# Patient Record
Sex: Male | Born: 1960 | Race: White | Hispanic: No | Marital: Married | Smoking: Never smoker
Health system: Southern US, Community
[De-identification: ages and names within clinical notes are randomized; demographics above are authoritative.]

## PROBLEM LIST (undated history)

## (undated) DIAGNOSIS — I10 Essential (primary) hypertension: Secondary | ICD-10-CM

## (undated) DIAGNOSIS — E079 Disorder of thyroid, unspecified: Secondary | ICD-10-CM

## (undated) DIAGNOSIS — E785 Hyperlipidemia, unspecified: Secondary | ICD-10-CM

---

## 2005-08-29 ENCOUNTER — Emergency Department: Payer: Self-pay | Admitting: Unknown Physician Specialty

## 2013-09-07 ENCOUNTER — Encounter: Payer: Self-pay | Admitting: Emergency Medicine

## 2013-09-07 ENCOUNTER — Emergency Department (INDEPENDENT_AMBULATORY_CARE_PROVIDER_SITE_OTHER): Payer: BC Managed Care – PPO

## 2013-09-07 ENCOUNTER — Emergency Department
Admission: EM | Admit: 2013-09-07 | Discharge: 2013-09-07 | Disposition: A | Discharge status: Home Health | Payer: BC Managed Care – PPO | Source: Home / Self Care | Attending: Emergency Medicine | Admitting: Emergency Medicine

## 2013-09-07 DIAGNOSIS — K352 Acute appendicitis with generalized peritonitis, without abscess: Secondary | ICD-10-CM

## 2013-09-07 DIAGNOSIS — K37 Unspecified appendicitis: Secondary | ICD-10-CM

## 2013-09-07 HISTORY — DX: Essential (primary) hypertension: I10

## 2013-09-07 HISTORY — DX: Hyperlipidemia, unspecified: E78.5

## 2013-09-07 HISTORY — DX: Disorder of thyroid, unspecified: E07.9

## 2013-09-07 LAB — POCT CBC W AUTO DIFF (K'VILLE URGENT CARE)

## 2013-09-07 LAB — POCT URINALYSIS DIPSTICK
Glucose, UA: NEGATIVE
Leukocytes, UA: NEGATIVE
Nitrite, UA: NEGATIVE
Spec Grav, UA: 1.025 (ref 1.005–1.03)
Urobilinogen, UA: 1 (ref 0–1)
pH, UA: 6.5 (ref 5–8)

## 2013-09-07 MED ORDER — IOHEXOL 300 MG/ML  SOLN
100.0000 mL | Freq: Once | INTRAMUSCULAR | Status: AC | PRN
  Administered 2013-09-07: 100 mL via INTRAVENOUS

## 2013-09-07 NOTE — ED Notes (Signed)
Reports onset of abdominal pain last night that was so severe he almost went to ER; some nausea but no vomiting or diarrhea. Pain now localized in right middle quadrant and is mild to moderate.

## 2013-09-07 NOTE — ED Provider Notes (Signed)
CSN: [id number]     Arrival date & time 09/07/13  1444 History   First MD Initiated Contact with Patient 09/07/13 1453     Chief Complaint  Patient presents with  . Abdominal Pain    Patient is a 53 y.o. male presenting with abdominal pain. The history is provided by the patient and the spouse.  Abdominal Pain This is a new problem. Episode onset: Last night at 6:30 PM. The problem occurs constantly. The problem has been gradually worsening. Associated symptoms include abdominal pain. Pertinent negatives include no chest pain, no headaches and no shortness of breath. Associated symptoms comments: Nausea. The symptoms are aggravated by walking. Nothing relieves the symptoms. Treatments tried: Omeprazole and 1 tramadol. The treatment provided no relief.   Here with wife. The abdominal pain is periumbilical but over the past few hours the pain has become crampy and sharp towards the right mid abdomen and right lower quadrant. Has nausea but no vomiting or diarrhea.  Had low-volume formed BM this morning, without blood or mucus. Pain intensity 5/10. Has been as high as 8/10. No history of prior abdominal surgery. No urinary symptoms. No chest pain or shortness of breath or URI symptoms. Denies fever chills. Denies focal neurologic symptoms. Denies lightheadedness or syncope Recalls no injury  Past Medical History  Diagnosis Date  . Hypertension   . Thyroid disease   . Hyperlipidemia    History reviewed. No pertinent past surgical history. Family History  Problem Relation Age of Onset  . Family history unknown: Yes   History  Substance Use Topics  . Smoking status: Never Smoker   . Smokeless tobacco: Not on file  . Alcohol Use: Yes    Review of Systems  Respiratory: Negative for shortness of breath.   Cardiovascular: Negative for chest pain.  Gastrointestinal: Positive for abdominal pain.  Neurological: Negative for headaches.  All other systems reviewed and are  negative.   Allergies  Review of patient's allergies indicates no known allergies.  Home Medications   Prior to Admission medications   Medication Sig Start Date End Date Taking? Authorizing Provider  levothyroxine (SYNTHROID, LEVOTHROID) 100 MCG tablet Take 100 mcg by mouth daily before breakfast.   Yes Historical Provider, MD  lisinopril (PRINIVIL,ZESTRIL) 10 MG tablet Take 10 mg by mouth daily.   Yes Historical Provider, MD  omeprazole (PRILOSEC) 10 MG capsule Take 10 mg by mouth daily.   Yes Historical Provider, MD   BP 126/81  Pulse 77  Temp(Src) 98.7 F (37.1 C) (Oral)  Resp 16  Ht 5\' 7"  (1.702 m)  Wt 199 lb (90.266 kg)  BMI 31.16 kg/m2  SpO2 95% Physical Exam  Nursing note and vitals reviewed. Constitutional: He is oriented to person, place, and time. He appears well-developed and well-nourished. He appears distressed (Uncomfortable from abdominal pain. No acute cardiorespiratory distress).  He appears ill, but he is alert, cooperative. He can ambulate on his own, but it appears that with walking, his abdominal pain is worsened  HENT:  Right Ear: External ear normal.  Left Ear: External ear normal.  Nose: Nose normal.  Mouth/Throat: Oropharynx is clear and moist. No oropharyngeal exudate.  Eyes: Conjunctivae are normal. Right eye exhibits no discharge. Left eye exhibits no discharge. No scleral icterus.  Neck: Normal range of motion. Neck supple. No JVD present. No thyromegaly present.  Cardiovascular: Normal rate, regular rhythm and normal heart sounds.  Exam reveals no gallop and no friction rub.   No murmur heard. Pulmonary/Chest: Effort  normal and breath sounds normal.  Abdominal: Soft. He exhibits no abdominal bruit and no pulsatile midline mass. Bowel sounds are decreased. There is no hepatosplenomegaly. There is tenderness. There is rebound, guarding and tenderness at McBurney's point. There is no rigidity, no CVA tenderness and negative Murphy's sign.  Mild to  moderate tenderness right mid and right lower quadrant.   Musculoskeletal: Normal range of motion.  Lymphadenopathy:    He has no cervical adenopathy.  Neurological: He is alert and oriented to person, place, and time. No cranial nerve deficit.  Skin: Skin is warm and dry. No rash noted. He is not diaphoretic.  Psychiatric: He has a normal mood and affect.    ED Course  Procedures (including critical care time) Labs Review Labs Reviewed  COMPREHENSIVE METABOLIC PANEL  AMYLASE  LIPASE  POCT CBC W AUTO DIFF ([facility])  POCT URINALYSIS DIPSTICK   Results for orders placed during the hospital encounter of 09/07/13  COMPREHENSIVE METABOLIC PANEL      Result Value Ref Range   Sodium 137  135 - 145 mEq/L   Potassium 4.4  3.5 - 5.3 mEq/L   Chloride 100  96 - 112 mEq/L   CO2 27  19 - 32 mEq/L   Glucose, Bld 95  70 - 99 mg/dL   BUN 16  6 - 23 mg/dL   Creat 4.09  7.35 - 3.29 mg/dL   Total Bilirubin 1.0  0.2 - 1.2 mg/dL   Alkaline Phosphatase 39  39 - 117 U/L   AST 19  0 - 37 U/L   ALT 29  0 - 53 U/L   Total Protein 7.3  6.0 - 8.3 g/dL   Albumin 5.0  3.5 - 5.2 g/dL   Calcium 92.4  8.4 - 26.8 mg/dL  AMYLASE      Result Value Ref Range   Amylase 18  0 - 105 U/L  LIPASE      Result Value Ref Range   Lipase 12  0 - 75 U/L  POCT CBC W AUTO DIFF ([facility])      Result Value Ref Range   WBC    4.5 - 10.5 K/uL   Lymphocytes relative %    15 - 45 %   Monocytes relative %    2 - 10 %   Neutrophils relative % (GR)    44 - 76 %   Lymphocytes absolute    0.1 - 1.8 K/uL   Monocyes absolute    0.1 - 1 K/uL   Neutrophils absolute (GR#)    1.7 - 7.7 K/uL   RBC    4.2 - 5.8 MIL/uL   Hemoglobin    13 - 17 g/dL   Hematocrit    34.1 - 51 %   MCV    80 - 98 fL   MCH    26.5 - 32.5 pg   MCHC    32.5 - 36.9 g/dL   RDW    96.2 - 14 %   Platelet count    140 - 400 K/uL   MPV    7.8 - 11 fL  POCT URINALYSIS DIPSTICK      Result Value Ref Range   Color, UA yellow      Clarity, UA clear     Glucose, UA neg     Bilirubin, UA small     Ketones, UA 15mg      Spec Grav, UA 1.025  1.005 - 1.03   Blood,  UA trace-intact     pH, UA 6.5  5 - 8   Protein, UA trace     Urobilinogen, UA 1.0  0 - 1   Nitrite, UA neg     Leukocytes, UA Negative     Urinalysis nonspecific, trace blood, question significance.   CBC: WBC 9.5, mild left shift with 73. 9% granulocytes. Hemoglobin normal 14.6. Platelets 274,000  Based on history and physical findings, I am extremely concerned that he may have early appendicitis. After discussion with patient and wife after risks, benefits, alternatives discussed, CT abdomen and pelvis with contrast ordered stat in our facility  Imaging Review Ct Abdomen Pelvis W Contrast  09/07/2013   CLINICAL DATA:  Right lower quadrant pain with tenderness and nausea.  EXAM: CT ABDOMEN AND PELVIS WITH CONTRAST  TECHNIQUE: Multidetector CT imaging of the abdomen and pelvis was performed using the standard protocol following bolus administration of intravenous contrast.  CONTRAST:  100mL OMNIPAQUE IOHEXOL 300 MG/ML  SOLN  COMPARISON:  None.  FINDINGS: The patient has early acute appendicitis. The appendix is dilated to a diameter of 12 mm with slight periappendiceal soft tissue stranding. No perforation or abscess.  The patient has hepatic steatosis. No focal liver lesions. Biliary tree, spleen, pancreas, adrenal glands, and kidneys are normal. Bladder is normal. No free air or free fluid. No acute osseous abnormality.  IMPRESSION: Early acute appendicitis.   Electronically Signed   By: Geanie CooleyJim  Maxwell M.D.   On: 09/07/2013 16:03     MDM   1. Acute appendicitis with generalized peritonitis     Over 45 minutes spent, greater than 50% of the time spent for counseling and coordination of care.  4:30 pm: At patient and wife's specific request, I called their preferred surgeon, Dr. Thedore MinsSingh at [phone]. Discussed at length with Dr. Thedore MinsSingh. He advised sending  [facility], where he will meet the patient there stat. Patient is ambulatory. Patient and wife declined ambulance although I explained risks of driving there in their vehicle. Wife to drive patient to [facility] stat. I gave them copies of CT report and blood tests to take with them.    Lajean Manesavid Massey, MD 09/09/13 1101

## 2013-09-08 LAB — COMPREHENSIVE METABOLIC PANEL
ALT: 29 U/L (ref 0–53)
AST: 19 U/L (ref 0–37)
Albumin: 5 g/dL (ref 3.5–5.2)
Alkaline Phosphatase: 39 U/L (ref 39–117)
BUN: 16 mg/dL (ref 6–23)
CO2: 27 mEq/L (ref 19–32)
Calcium: 10 mg/dL (ref 8.4–10.5)
Chloride: 100 mEq/L (ref 96–112)
Creat: 1 mg/dL (ref 0.50–1.35)
Glucose, Bld: 95 mg/dL (ref 70–99)
Potassium: 4.4 mEq/L (ref 3.5–5.3)
Sodium: 137 mEq/L (ref 135–145)
Total Bilirubin: 1 mg/dL (ref 0.2–1.2)
Total Protein: 7.3 g/dL (ref 6.0–8.3)

## 2013-09-08 LAB — AMYLASE: Amylase: 18 U/L (ref 0–105)

## 2013-09-08 LAB — LIPASE: Lipase: 12 U/L (ref 0–75)

## 2013-09-12 ENCOUNTER — Telehealth: Payer: Self-pay | Admitting: Emergency Medicine

## 2013-09-12 LAB — POCT CBC W AUTO DIFF (K'VILLE URGENT CARE)

## 2016-05-12 IMAGING — CT CT ABD-PELV W/ CM
3 of 5 series · 14 of 32 positions shown, 19 images · IV contrast (omnipaque)
Comparison: None.

CLINICAL DATA: Right lower quadrant pain with tenderness and
nausea.

EXAM:
CT ABDOMEN AND PELVIS WITH CONTRAST
TECHNIQUE: Multidetector CT imaging of the abdomen and pelvis was performed
using the standard protocol following bolus administration of
intravenous contrast.
CONTRAST:  100mL OMNIPAQUE IOHEXOL 300 MG/ML  SOLN

[Series 2: abd/pelvis with · axial · 0.78mm/px · z∈[-430,-160]mm · 4 of 92 slices shown, 9 images]
[im 19/92  soft-tissue]
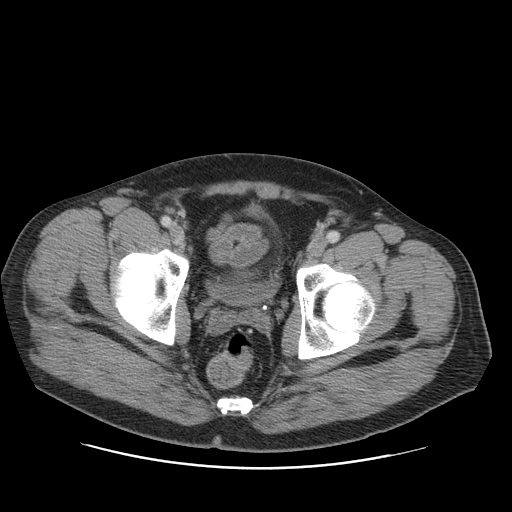
[im 19/92  lung]
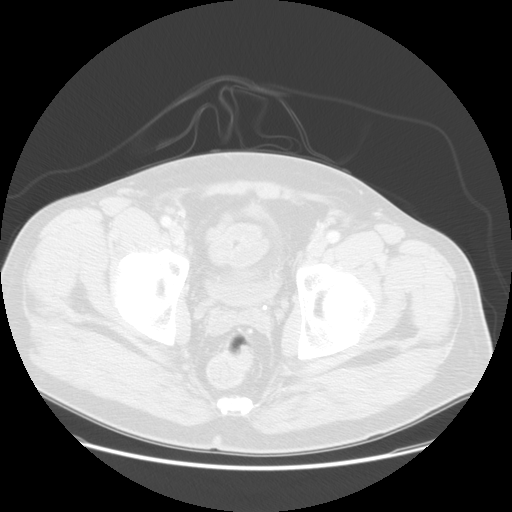
[im 19/92  bone]
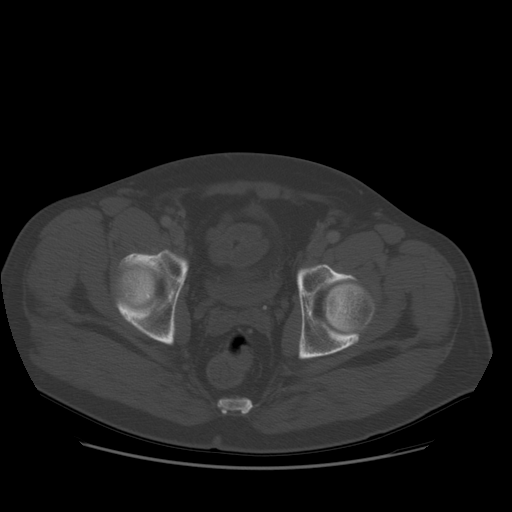
[im 37/92  soft-tissue]
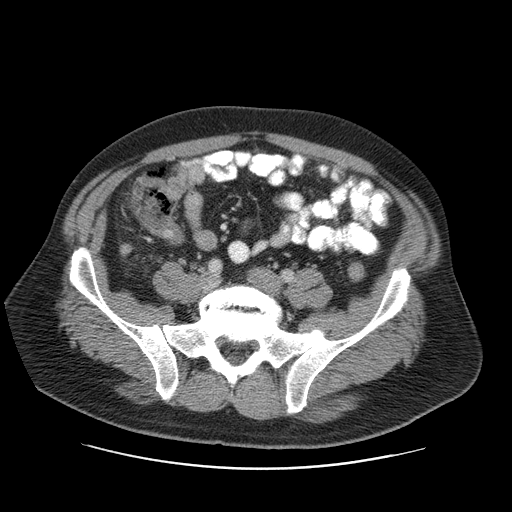
[im 37/92  lung]
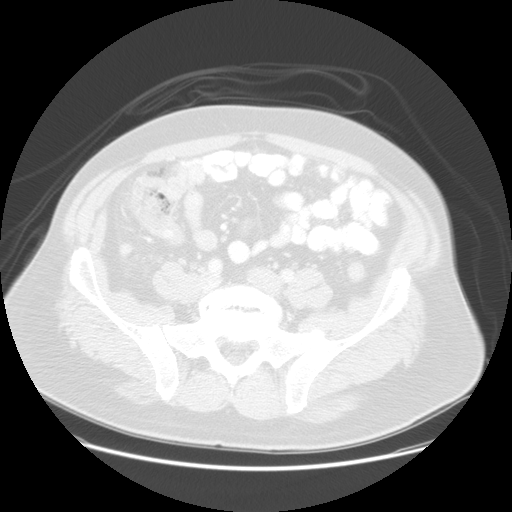
[im 55/92  soft-tissue]
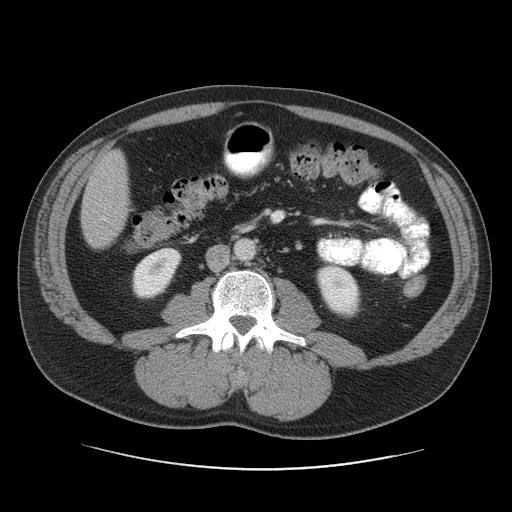
[im 55/92  lung]
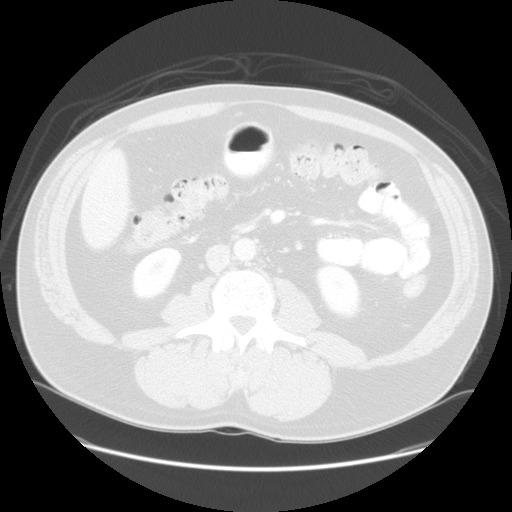
[im 73/92  soft-tissue]
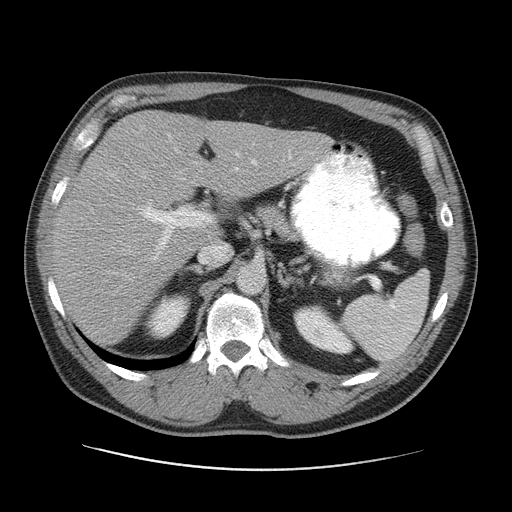
[im 73/92  lung]
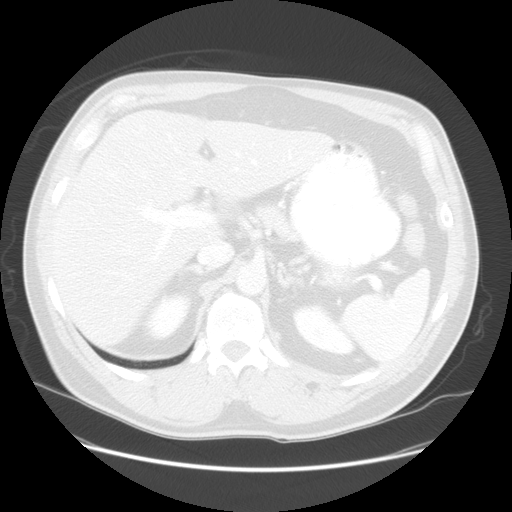

[Series 400: sag · sagittal · 0.92mm/px · 8 of 185 slices shown]
[im 19/185  soft-tissue]
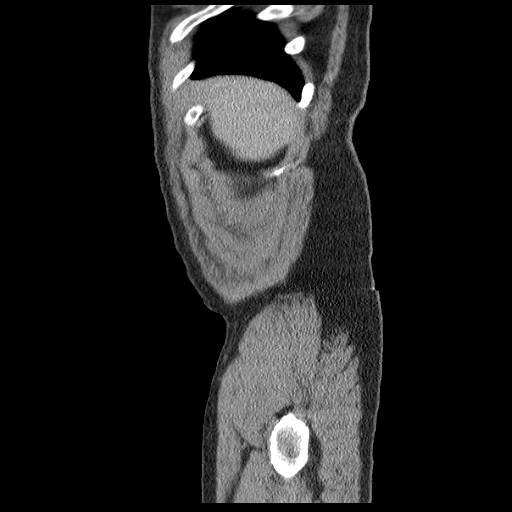
[im 37/185  soft-tissue]
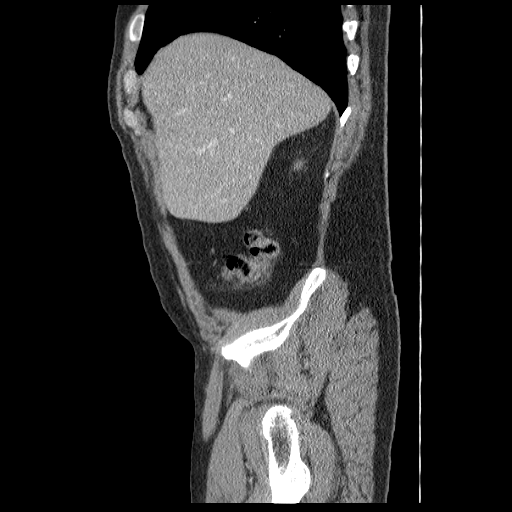
[im 56/185  soft-tissue]
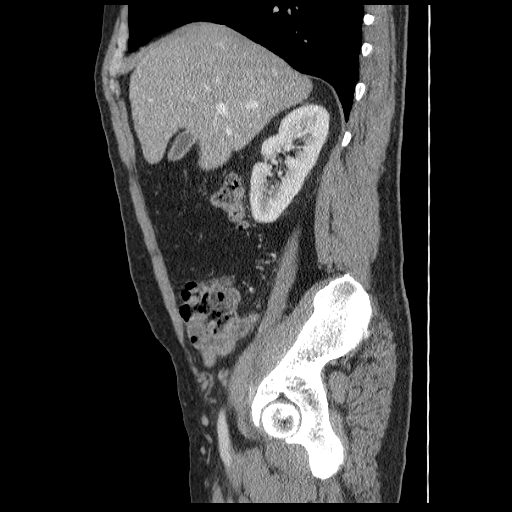
[im 74/185  soft-tissue]
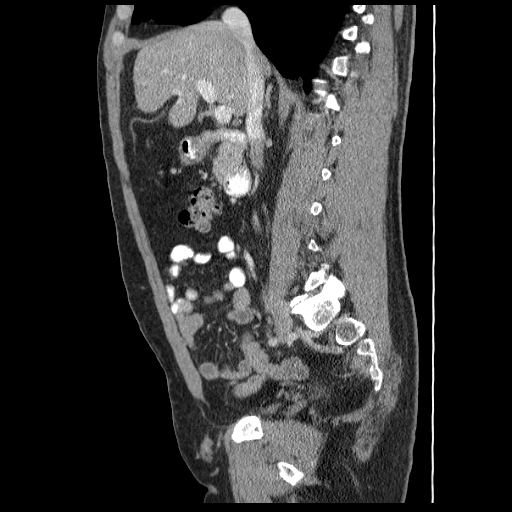
[im 111/185  soft-tissue]
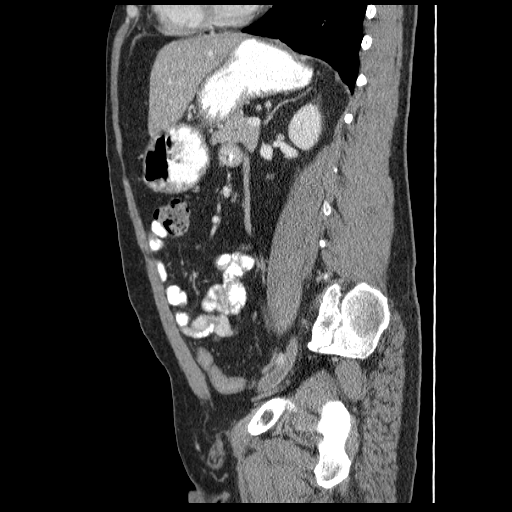
[im 129/185  soft-tissue]
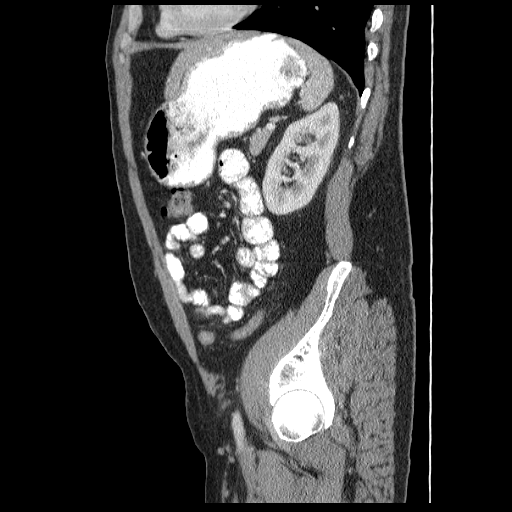
[im 148/185  soft-tissue]
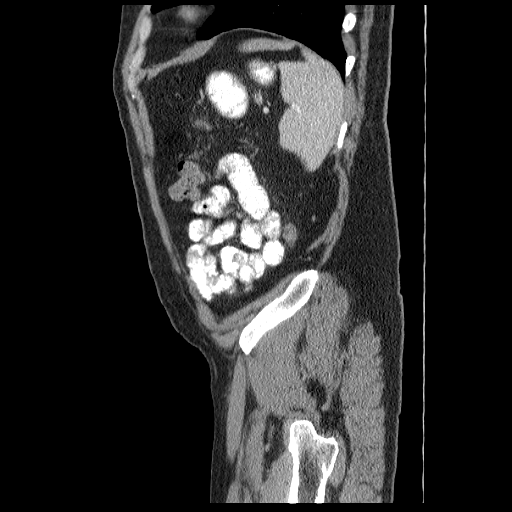
[im 166/185  soft-tissue]
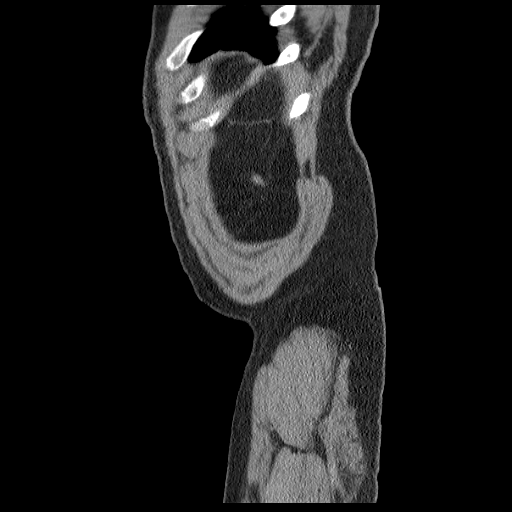

[Series 401: cor · coronal · 0.92mm/px · 2 of 184 slices shown]
[im 19/184  soft-tissue]
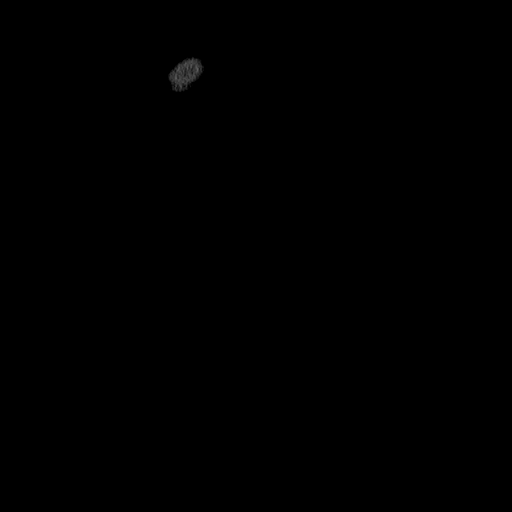
[im 37/184  soft-tissue]
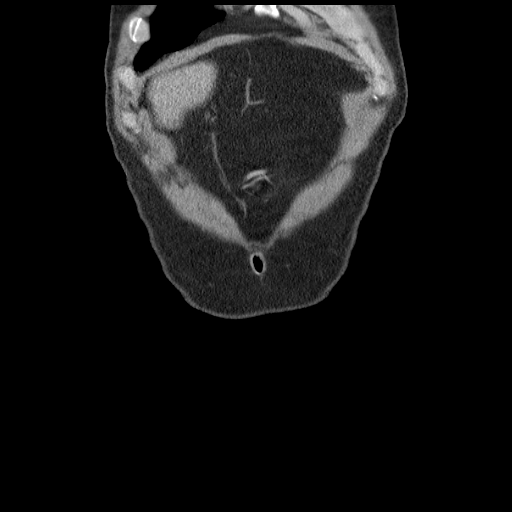

[14 of 32 positions shown; findings below may reference images not displayed]

FINDINGS: The patient has early acute appendicitis. The appendix is dilated to
a diameter of 12 mm with slight periappendiceal soft tissue
stranding. No perforation or abscess.

The patient has hepatic steatosis. No focal liver lesions. Biliary
tree, spleen, pancreas, adrenal glands, and kidneys are normal.
Bladder is normal. No free air or free fluid. No acute osseous
abnormality.
IMPRESSION: Early acute appendicitis.

## 2017-08-07 ENCOUNTER — Other Ambulatory Visit: Payer: Self-pay

## 2017-08-07 ENCOUNTER — Emergency Department (INDEPENDENT_AMBULATORY_CARE_PROVIDER_SITE_OTHER)
Admission: EM | Admit: 2017-08-07 | Discharge: 2017-08-07 | Disposition: A | Discharge status: Home / Self Care | Payer: 59 | Source: Home / Self Care | Attending: Family Medicine | Admitting: Family Medicine

## 2017-08-07 ENCOUNTER — Emergency Department (INDEPENDENT_AMBULATORY_CARE_PROVIDER_SITE_OTHER): Payer: 59

## 2017-08-07 DIAGNOSIS — K921 Melena: Secondary | ICD-10-CM

## 2017-08-07 DIAGNOSIS — K5900 Constipation, unspecified: Secondary | ICD-10-CM

## 2017-08-07 DIAGNOSIS — K5909 Other constipation: Secondary | ICD-10-CM

## 2017-08-07 DIAGNOSIS — R1032 Left lower quadrant pain: Secondary | ICD-10-CM

## 2017-08-07 LAB — POCT CBC W AUTO DIFF (K'VILLE URGENT CARE)

## 2017-08-07 MED ORDER — CIPROFLOXACIN HCL 500 MG PO TABS: 500.0000 mg | ORAL_TABLET | Freq: Two times a day (BID) | ORAL | 0 refills | Status: AC

## 2017-08-07 MED ORDER — METRONIDAZOLE 500 MG PO TABS: ORAL_TABLET | ORAL | 0 refills | Status: AC

## 2017-08-07 NOTE — ED Triage Notes (Signed)
Pt c/o bloody stools since Friday morning. Thurs night took 3 laxatives due to constipation. Woke up early Friday to bloody, loose stool and some cramping that has not resolved since.

## 2017-08-07 NOTE — ED Provider Notes (Signed)
[facility]    CSN: [id number] Arrival date & time: 08/07/17  0912     History   Chief Complaint Chief Complaint  Patient presents with  . Rectal Bleeding    HPI Christian Goodwin is a 57 y.o. male.   Patient reports that he normally has a bowel movement every day, but two days ago he felt constipated and took three tabs of bisacodyl.  He subsequently developed crampy lower abdominal pain, and yesterday awoke with bloody loose stool.  He had chills yesterday.  He had a small bowel movement yesterday afternoon with decreased blood, and his abdominal cramps have improved but are still present. He states that he started taking aspirin 324mg  daily about 3 weeks ago.  He has a history of hypothyroid with a normal TSH in January 2019.  He has a past history of appendectomy.  He had a negative screening colonoscopy 10/26/12.  No family history of GI disorders.  The history is provided by the patient.    Past Medical History:  Diagnosis Date  . Hyperlipidemia   . Hypertension   . Thyroid disease     There are no active problems to display for this patient.   History reviewed. No pertinent surgical history.     Home Medications    Prior to Admission medications   Medication Sig Start Date End Date Taking? Authorizing Provider  amLODipine (NORVASC) 10 MG tablet Take 10 mg by mouth daily.   Yes [provider]  aspirin 325 MG tablet Take 325 mg by mouth at bedtime.   Yes [provider]  ciprofloxacin (CIPRO) 500 MG tablet Take 1 tablet (500 mg total) by mouth 2 (two) times daily. 08/07/17   Lattie Haw, MD  levothyroxine (SYNTHROID, LEVOTHROID) 100 MCG tablet Take 100 mcg by mouth daily before breakfast.    [provider]  lisinopril (PRINIVIL,ZESTRIL) 10 MG tablet Take 10 mg by mouth daily.    [provider]  metroNIDAZOLE (FLAGYL) 500 MG tablet Take one tab PO Q8hr 08/07/17   Lattie Haw, MD  omeprazole (PRILOSEC) 10 MG  capsule Take 10 mg by mouth daily.    [provider]    Family History Family History  Family history unknown: Yes    Social History Social History   Tobacco Use  . Smoking status: Never Smoker  . Smokeless tobacco: Never Used  Substance Use Topics  . Alcohol use: Yes  . Drug use: No     Allergies   Patient has no known allergies.   Review of Systems Review of Systems  Constitutional: Positive for chills and fatigue. Negative for activity change, appetite change, diaphoresis, fever and unexpected weight change.  HENT: Negative.   Eyes: Negative.   Respiratory: Negative.   Cardiovascular: Negative.   Gastrointestinal: Positive for abdominal pain, blood in stool, constipation and diarrhea. Negative for abdominal distention, nausea, rectal pain and vomiting.  Genitourinary: Negative.   Musculoskeletal: Negative.   Skin: Negative.   Neurological: Negative for headaches.  Hematological: Negative.      Physical Exam Triage Vital Signs ED Triage Vitals  Enc Vitals Group     BP 08/07/17 0926 138/89     Pulse Rate 08/07/17 0926 68     Resp --      Temp 08/07/17 0926 98.6 F (37 C)     Temp Source 08/07/17 0926 Oral     SpO2 08/07/17 0926 99 %     Weight 08/07/17 0927 188 lb (85.3  kg)     Height 08/07/17 0927 5\' 7"  (1.702 m)     Head Circumference --      Peak Flow --      Pain Score 08/07/17 0926 0     Pain Loc --      Pain Edu? --      Excl. in GC? --    No data found.  Updated Vital Signs BP 138/89 (BP Location: Right Arm)   Pulse 68   Temp 98.6 F (37 C) (Oral)   Ht 5\' 7"  (1.702 m)   Wt 188 lb (85.3 kg)   SpO2 99%   BMI 29.44 kg/m   Visual Acuity Right Eye Distance:   Left Eye Distance:   Bilateral Distance:    Right Eye Near:   Left Eye Near:    Bilateral Near:     Physical Exam  Constitutional: He appears well-developed and well-nourished. No distress.  HENT:  Head: Normocephalic.  Right Ear: External ear normal.  Left Ear:  External ear normal.  Nose: Nose normal.  Mouth/Throat: Oropharynx is clear and moist.  Eyes: Pupils are equal, round, and reactive to light. Conjunctivae are normal.  Neck: Neck supple.  Cardiovascular: Normal heart sounds.  Pulmonary/Chest: Breath sounds normal.  Abdominal: Soft. Bowel sounds are normal. There is no hepatosplenomegaly. There is tenderness in the right lower quadrant, periumbilical area and left lower quadrant. There is no rigidity, no guarding, no CVA tenderness and no tenderness at McBurney's point.    Musculoskeletal: He exhibits no edema.  Lymphadenopathy:    He has no cervical adenopathy.  Neurological: He is alert.  Skin: Skin is warm and dry. No rash noted.  Nursing note and vitals reviewed.    UC Treatments / Results  Labs (all labs ordered are listed, but only abnormal results are displayed) Labs Reviewed  POCT CBC W AUTO DIFF ([facility]):  WBC 7.3; LY 22.6; MO 6.1; GR 71.3; Hgb 15.5; Platelets 289     EKG None  Radiology Dg Abdomen 1 View  Result Date: 08/07/2017 CLINICAL DATA:  Constipation with blood in stool EXAM: ABDOMEN - 1 VIEW COMPARISON:  CT abdomen and pelvis September 07, 2013 FINDINGS: There is moderate stool throughout the colon. There is no bowel dilatation or air-fluid level to suggest bowel obstruction. No free air. There are phleboliths in the pelvis. IMPRESSION: Moderate stool in colon.  No evident bowel obstruction or free air. Electronically Signed   By: Bretta BangWilliam  Woodruff III M.D.   On: 08/07/2017 10:15    Procedures Procedures (including critical care time)  Medications Ordered in UC Medications - No data to display  Initial Impression / Assessment and Plan / UC Course  I have reviewed the triage vital signs and the nursing notes.  Pertinent labs & imaging results that were available during my care of the patient were reviewed by me and considered in my medical decision making (see chart for details).    Although  patient's WBC normal (7.3), symptoms are suggestive of sub-acute diverticulitis.  Will begin empiric Cipro and Flagyl for 5 days, with initial clear liquid diet for 24 hours.  Normal Hgb reassuring.  Advised to discontinue aspirin. Recommend follow-up with PCP within 4 to 5 days.  Because of recent constipation, recommend follow-up of hypothyroid.  Recommend colonoscopy when present symptoms resolved.   Final Clinical Impressions(s) / UC Diagnoses   Final diagnoses:  Hematochezia  Abdominal pain, left lower quadrant  Other constipation     Discharge  Instructions     Begin clear liquids for about 24 hours, then may begin a [employer] (Bananas, Rice, Applesauce, Toast) when improved. Then gradually advance to a regular diet as tolerated.   If symptoms become significantly worse during the night or over the weekend, proceed to the local emergency room.     ED Prescriptions    Medication Sig Dispense Auth. Provider   ciprofloxacin (CIPRO) 500 MG tablet Take 1 tablet (500 mg total) by mouth 2 (two) times daily. 10 tablet Lattie Haw, MD   metroNIDAZOLE (FLAGYL) 500 MG tablet Take one tab PO Q8hr 15 tablet Lattie Haw, MD        Lattie Haw, MD 08/07/17 [phone]

## 2017-08-07 NOTE — Discharge Instructions (Addendum)
Begin clear liquids for about 24 hours, then may begin a BR[employer] diet (Bananas, Rice, Applesauce, Toast) when improved. Then gradually advance to a regular diet as tolerated.    °  °If symptoms become significantly worse during the night or over the weekend, proceed to the local emergency room.  °

## 2017-08-09 ENCOUNTER — Telehealth: Payer: Self-pay

## 2017-08-09 NOTE — Telephone Encounter (Signed)
Attempted to call - VM not set up.

## 2017-08-10 NOTE — Telephone Encounter (Signed)
Spoke with patient.  Feeling better.  Started eating solid food last night.  Will follow up as needed.

## 2020-04-11 IMAGING — DX DG ABDOMEN 1V
2 series · 2 of 2 positions shown · non-contrast
Comparison: CT abdomen and pelvis September 07, 2013

CLINICAL DATA: Constipation with blood in stool

EXAM:
ABDOMEN - 1 VIEW

[abdomen kub (1 of 2)]
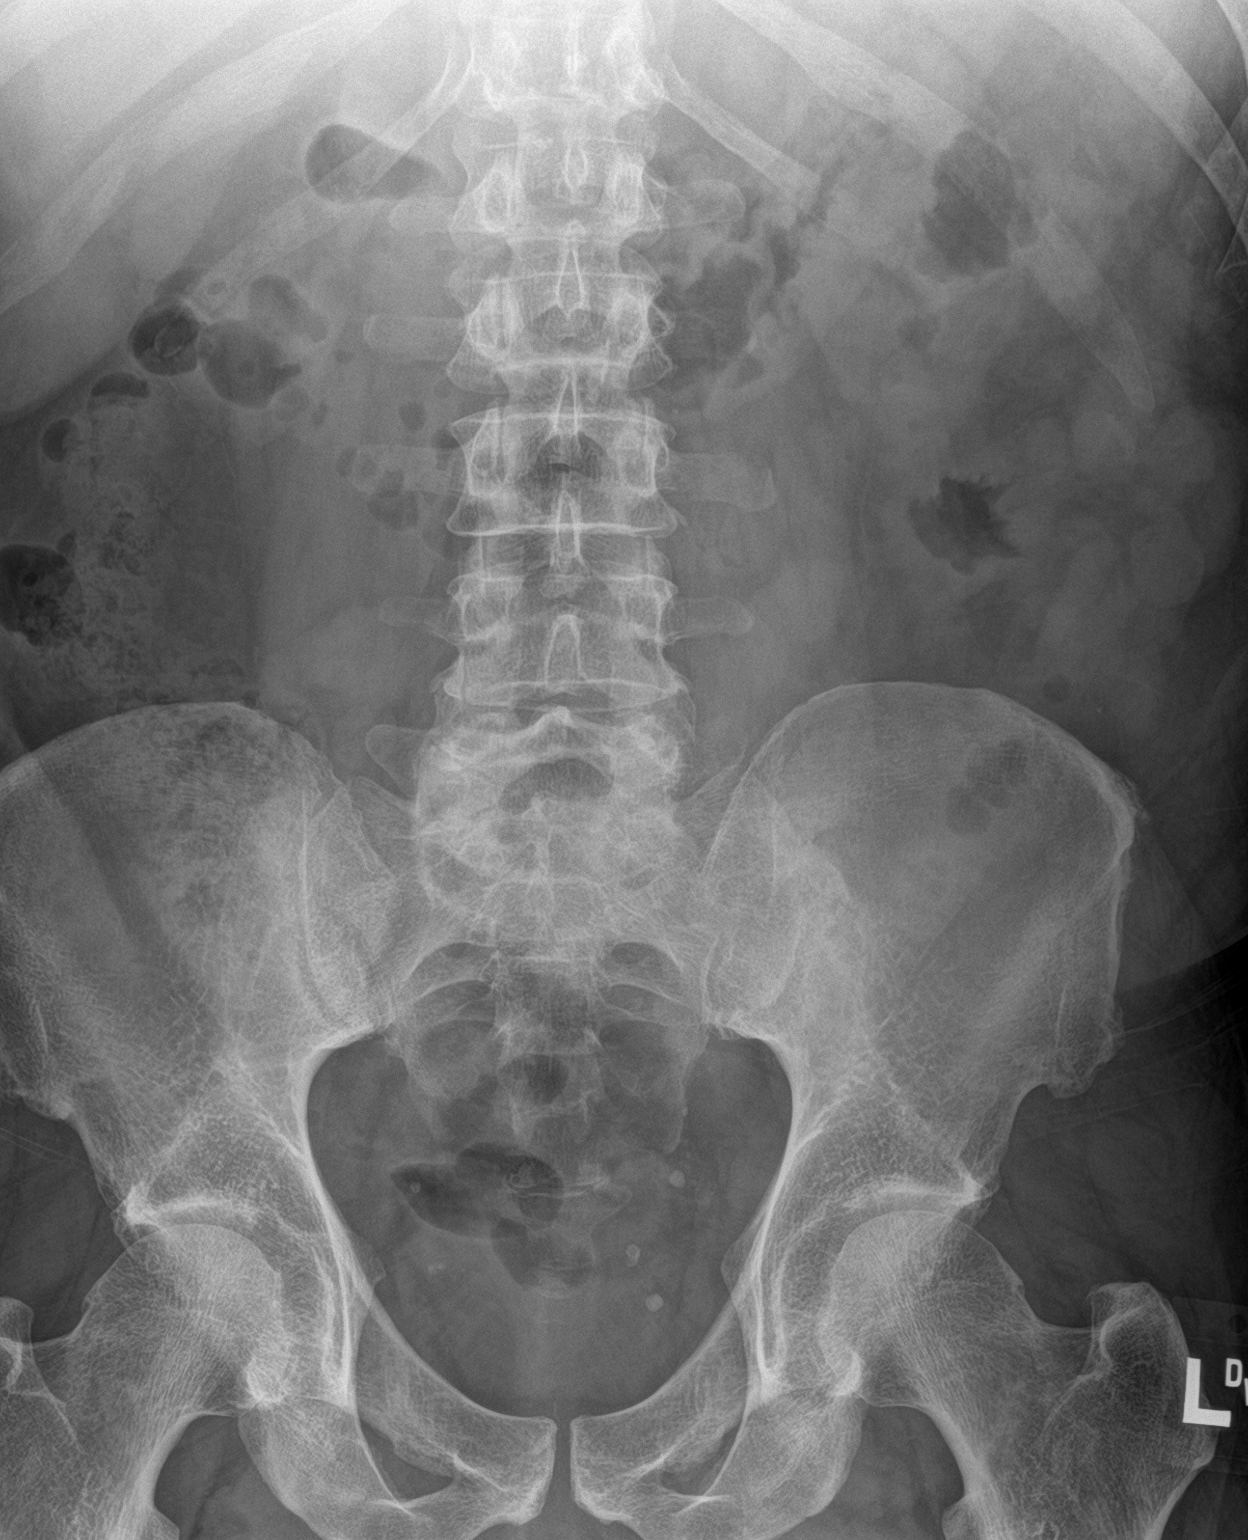

[abdomen kub (2 of 2)]
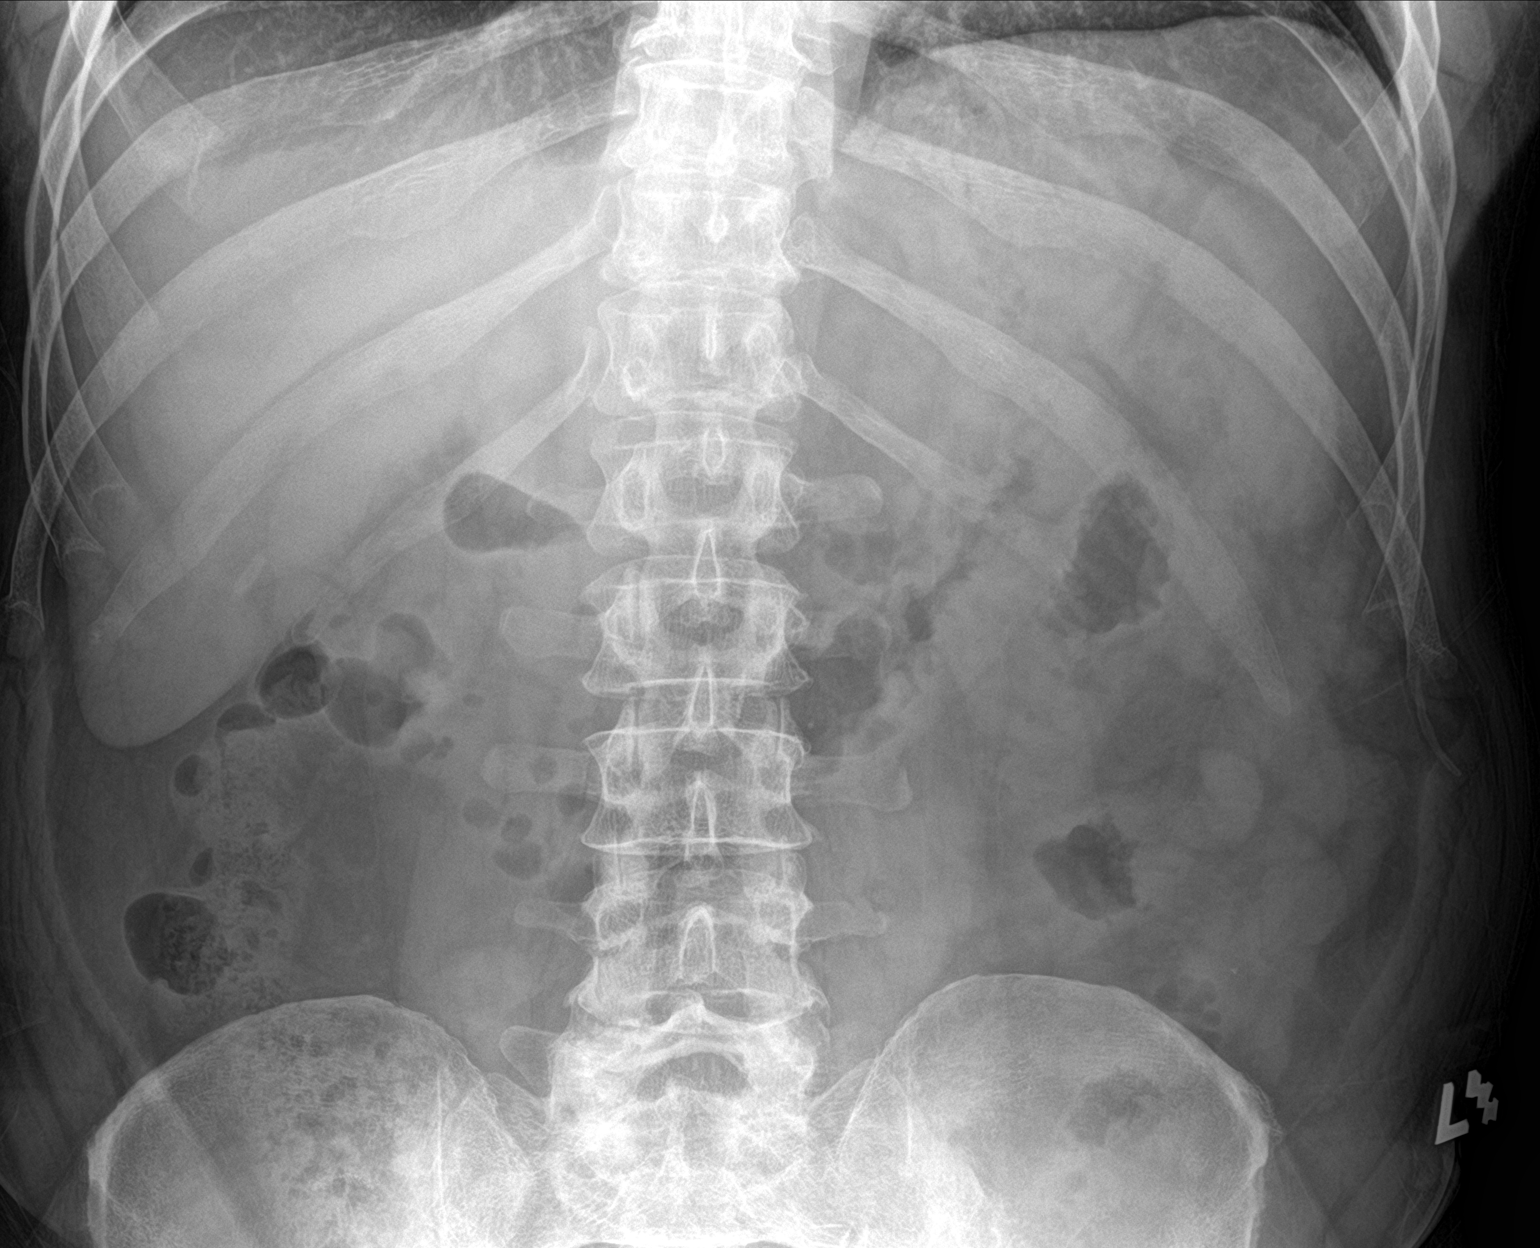

[2 of 2 positions shown; findings below may reference images not displayed]

FINDINGS: There is moderate stool throughout the colon. There is no bowel
dilatation or air-fluid level to suggest bowel obstruction. No free
air. There are phleboliths in the pelvis.
IMPRESSION: Moderate stool in colon.  No evident bowel obstruction or free air.
